# Patient Record
Sex: Female | Born: 1975 | Race: White | Hispanic: No | Marital: Single | Smoking: Never smoker
Health system: Southern US, Community
[De-identification: ages and names within clinical notes are randomized; demographics above are authoritative.]

## PROBLEM LIST (undated history)

## (undated) DIAGNOSIS — F32A Depression, unspecified: Secondary | ICD-10-CM

## (undated) DIAGNOSIS — F329 Major depressive disorder, single episode, unspecified: Secondary | ICD-10-CM

## (undated) DIAGNOSIS — E079 Disorder of thyroid, unspecified: Secondary | ICD-10-CM

---

## 2014-12-14 HISTORY — PX: AUGMENTATION MAMMAPLASTY: SUR837

## 2014-12-14 HISTORY — PX: REDUCTION MAMMAPLASTY: SUR839

## 2015-10-18 ENCOUNTER — Other Ambulatory Visit: Payer: Self-pay | Admitting: Physician Assistant

## 2015-10-18 DIAGNOSIS — Z1239 Encounter for other screening for malignant neoplasm of breast: Secondary | ICD-10-CM

## 2015-10-31 ENCOUNTER — Other Ambulatory Visit: Payer: Self-pay | Admitting: Physician Assistant

## 2015-10-31 ENCOUNTER — Ambulatory Visit
Admission: RE | Admit: 2015-10-31 | Discharge: 2015-10-31 | Disposition: A | Discharge status: Home / Self Care | Payer: BC Managed Care – PPO | Source: Ambulatory Visit | Attending: Physician Assistant | Admitting: Physician Assistant

## 2015-10-31 DIAGNOSIS — Z1239 Encounter for other screening for malignant neoplasm of breast: Secondary | ICD-10-CM

## 2015-10-31 DIAGNOSIS — Z1231 Encounter for screening mammogram for malignant neoplasm of breast: Secondary | ICD-10-CM | POA: Insufficient documentation

## 2015-11-03 ENCOUNTER — Ambulatory Visit: Payer: Self-pay

## 2015-11-09 ENCOUNTER — Inpatient Hospital Stay
Admission: RE | Admit: 2015-11-09 | Discharge: 2015-11-09 | Disposition: A | Discharge status: Home / Self Care | Payer: Self-pay | Source: Ambulatory Visit | Attending: *Deleted | Admitting: *Deleted

## 2015-11-09 ENCOUNTER — Other Ambulatory Visit: Payer: Self-pay | Admitting: *Deleted

## 2015-11-09 DIAGNOSIS — Z9289 Personal history of other medical treatment: Secondary | ICD-10-CM

## 2016-05-26 ENCOUNTER — Ambulatory Visit
Admission: EM | Admit: 2016-05-26 | Discharge: 2016-05-26 | Disposition: A | Discharge status: Home / Self Care | Payer: BC Managed Care – PPO | Attending: Family Medicine | Admitting: Family Medicine

## 2016-05-26 DIAGNOSIS — J40 Bronchitis, not specified as acute or chronic: Secondary | ICD-10-CM

## 2016-05-26 DIAGNOSIS — J0100 Acute maxillary sinusitis, unspecified: Secondary | ICD-10-CM | POA: Diagnosis not present

## 2016-05-26 MED ORDER — AMOXICILLIN-POT CLAVULANATE 875-125 MG PO TABS: 1.0000 | ORAL_TABLET | Freq: Two times a day (BID) | ORAL | 0 refills | Status: DC

## 2016-05-26 MED ORDER — BENZONATATE 200 MG PO CAPS: 200.0000 mg | ORAL_CAPSULE | Freq: Three times a day (TID) | ORAL | 0 refills | Status: DC | PRN

## 2016-05-26 MED ORDER — FEXOFENADINE-PSEUDOEPHED ER 180-240 MG PO TB24: 1.0000 | ORAL_TABLET | Freq: Every day | ORAL | 0 refills | Status: DC

## 2016-05-26 NOTE — ED Triage Notes (Signed)
Patient complains of sinus pain and pressure, possible bronchitis with coughing. Patient states that she is leaving in 3 hours to go out of the country for a cruise. Patient states that this has been ongoing for 13 days. Patient reports that she saw her primary care on day 10 and was given Cheratussin and was told to call back if not improving.

## 2016-05-26 NOTE — ED Provider Notes (Signed)
[facility]    CSN: [id number] Arrival date & time: 05/26/16  4098     History   Chief Complaint Chief Complaint  Patient presents with  . Sinusitis    HPI Sarah Vance is a 41 y.o. female.   Patient is here because of pressure swelling maxillary facial area. And cough. She states she normally often will get sinus infection she has a cold. She states this symptoms started the day after Easter which are benign April 2. She claims also that she went to see her PCP on Wednesday discussed this with her PCP and the PVC even though this may going on that point for 9 days only gave her some Cheratussin. She states that she is been unable to sleep up off of the night coughing and congestion. She states this started off with the nasal congestion and now is gone into the lungs. The last few days is progressively gotten worse and she is unable to sleep at night. She's only got a few hours of sleep last night and she's pain to board a plane about 2 hours to head to a cruise. She states feels miserable and wants to be put on a Z-Pak. She denies any smoking no known drug allergies. She reports having wisdom teeth removed for breast augmentation and gallbladder removal. Only medical problems hypothyroidism and she denies any pertinent family medical history relevant to today's visit   The history is provided by the patient. No language interpreter was used.  Sinusitis  Pain details:    Location:  Maxillary   Quality:  Aching   Timing:  Constant Duration:  12 days Progression:  Worsening Chronicity:  New Context: recent URI   Relieved by:  Nothing Worsened by:  Nothing Ineffective treatments:  Inhaler use Associated symptoms: cough     History reviewed. No pertinent past medical history.  There are no active problems to display for this patient.   Past Surgical History:  Procedure Laterality Date  . AUGMENTATION MAMMAPLASTY Bilateral 12/2014   had mammoplasty-lift    OB  History    No data available       Home Medications    Prior to Admission medications   Medication Sig Start Date End Date Taking? Authorizing Provider  cholecalciferol (VITAMIN D) 400 units TABS tablet Take 400 Units by mouth.   Yes Historical Provider, MD  FLUoxetine (PROZAC) 20 MG tablet Take 20 mg by mouth daily.   Yes Historical Provider, MD  levothyroxine (SYNTHROID, LEVOTHROID) 112 MCG tablet Take 112 mcg by mouth daily before breakfast.   Yes Historical Provider, MD  amoxicillin-clavulanate (AUGMENTIN) 875-125 MG tablet Take 1 tablet by mouth 2 (two) times daily. 05/26/16   Hassan Rowan, MD  benzonatate (TESSALON) 200 MG capsule Take 1 capsule (200 mg total) by mouth 3 (three) times daily as needed. 05/26/16   Hassan Rowan, MD  fexofenadine-pseudoephedrine (ALLEGRA-D ALLERGY & CONGESTION) 180-240 MG 24 hr tablet Take 1 tablet by mouth daily. 05/26/16   Hassan Rowan, MD    Family History Family History  Problem Relation Age of Onset  . Breast cancer Neg Hx     Social History Social History  Substance Use Topics  . Smoking status: Never Smoker  . Smokeless tobacco: Never Used  . Alcohol use No     Allergies   Patient has no known allergies.   Review of Systems Review of Systems  HENT: Positive for facial swelling, sinus pain and sinus pressure.   Respiratory: Positive for  cough.   All other systems reviewed and are negative.    Physical Exam Triage Vital Signs ED Triage Vitals  Enc Vitals Group     BP 05/26/16 0936 138/87     Pulse Rate 05/26/16 0936 83     Resp 05/26/16 0936 18     Temp 05/26/16 0936 98.5 F (36.9 C)     Temp Source 05/26/16 0936 Oral     SpO2 05/26/16 0936 95 %     Weight 05/26/16 0934 183 lb (83 kg)     Height 05/26/16 0934  (1.626 m)     Head Circumference --      Peak Flow --      Pain Score 05/26/16 0934 8     Pain Loc --      Pain Edu? --      Excl. in GC? --    No data found.   Updated Vital Signs BP 138/87 (BP  Location: Left Arm)   Pulse 83   Temp 98.5 F (36.9 C) (Oral)   Resp 18   Ht  (1.626 m)   Wt 183 lb (83 kg)   LMP 05/16/2016   SpO2 95%   BMI 31.41 kg/m   Visual Acuity Right Eye Distance:   Left Eye Distance:   Bilateral Distance:    Right Eye Near:   Left Eye Near:    Bilateral Near:     Physical Exam  Constitutional: She is oriented to person, place, and time. She appears well-developed and well-nourished.  HENT:  Head: Normocephalic and atraumatic.  Right Ear: Hearing, tympanic membrane, external ear and ear canal normal.  Left Ear: Hearing, tympanic membrane, external ear and ear canal normal.  Nose: Mucosal edema present. Right sinus exhibits no maxillary sinus tenderness and no frontal sinus tenderness. Left sinus exhibits maxillary sinus tenderness. Left sinus exhibits no frontal sinus tenderness.  Mouth/Throat: Uvula is midline, oropharynx is clear and moist and mucous membranes are normal.  Eyes: Conjunctivae, EOM and lids are normal. Pupils are equal, round, and reactive to light.  Neck: Trachea normal, normal range of motion and full passive range of motion without pain. Neck supple.  Cardiovascular: Normal rate and regular rhythm.   Pulmonary/Chest: Effort normal.  Patient actively coughing  Musculoskeletal: Normal range of motion.  Lymphadenopathy:    She has cervical adenopathy.  Neurological: She is alert and oriented to person, place, and time.  Skin: Skin is warm.  Psychiatric: She has a normal mood and affect.  Vitals reviewed.    UC Treatments / Results  Labs (all labs ordered are listed, but only abnormal results are displayed) Labs Reviewed - No data to display  EKG  EKG Interpretation None       Radiology No results found.  Procedures Procedures (including critical care time)  Medications Ordered in UC Medications - No data to display   Initial Impression / Assessment and Plan / UC Course  I have reviewed the triage vital  signs and the nursing notes.  Pertinent labs & imaging results that were available during my care of the patient were reviewed by me and considered in my medical decision making (see chart for details).     Patient may well have a sinus infection is progressively gotten worse and even a bronchitis since according to her history this is going on for 12 days down is progressively getting worse. But said place on Z-Pak and she requested recommending Augmentin 875 for 10 days she's  been on Cheratussin by her doctor I'm going recommend Tessalon Perles she states has worked effectively for her acquiesce to that and I'm also place on Allegra-D 1 tablet a day.  Final Clinical Impressions(s) / UC Diagnoses   Final diagnoses:  Acute maxillary sinusitis, recurrence not specified  Bronchitis    New Prescriptions New Prescriptions   AMOXICILLIN-CLAVULANATE (AUGMENTIN) 875-125 MG TABLET    Take 1 tablet by mouth 2 (two) times daily.   BENZONATATE (TESSALON) 200 MG CAPSULE    Take 1 capsule (200 mg total) by mouth 3 (three) times daily as needed.   FEXOFENADINE-PSEUDOEPHEDRINE (ALLEGRA-D ALLERGY & CONGESTION) 180-240 MG 24 HR TABLET    Take 1 tablet by mouth daily.     Note: This dictation was prepared with Dragon dictation along with smaller phrase technology. Any transcriptional errors that result from this process are unintentional.   Hassan Rowan, MD 05/26/16 1030

## 2016-10-12 ENCOUNTER — Other Ambulatory Visit: Payer: Self-pay | Admitting: Family Medicine

## 2016-10-12 DIAGNOSIS — Z1231 Encounter for screening mammogram for malignant neoplasm of breast: Secondary | ICD-10-CM

## 2016-11-14 ENCOUNTER — Ambulatory Visit
Admission: RE | Admit: 2016-11-14 | Discharge: 2016-11-14 | Disposition: A | Discharge status: Home / Self Care | Payer: BC Managed Care – PPO | Source: Ambulatory Visit | Attending: Family Medicine | Admitting: Family Medicine

## 2016-11-14 DIAGNOSIS — Z1231 Encounter for screening mammogram for malignant neoplasm of breast: Secondary | ICD-10-CM | POA: Diagnosis not present

## 2016-12-24 ENCOUNTER — Other Ambulatory Visit: Payer: Self-pay | Admitting: Physician Assistant

## 2016-12-24 DIAGNOSIS — R221 Localized swelling, mass and lump, neck: Secondary | ICD-10-CM

## 2016-12-26 ENCOUNTER — Other Ambulatory Visit: Payer: Self-pay | Admitting: Physician Assistant

## 2016-12-26 DIAGNOSIS — R221 Localized swelling, mass and lump, neck: Secondary | ICD-10-CM

## 2016-12-26 DIAGNOSIS — E039 Hypothyroidism, unspecified: Secondary | ICD-10-CM

## 2016-12-27 ENCOUNTER — Ambulatory Visit
Admission: RE | Admit: 2016-12-27 | Discharge: 2016-12-27 | Disposition: A | Discharge status: Home / Self Care | Payer: BC Managed Care – PPO | Source: Ambulatory Visit | Attending: Physician Assistant | Admitting: Physician Assistant

## 2016-12-27 ENCOUNTER — Ambulatory Visit: Payer: BC Managed Care – PPO

## 2016-12-27 DIAGNOSIS — E039 Hypothyroidism, unspecified: Secondary | ICD-10-CM | POA: Diagnosis not present

## 2016-12-27 DIAGNOSIS — R221 Localized swelling, mass and lump, neck: Secondary | ICD-10-CM | POA: Diagnosis not present

## 2017-03-09 ENCOUNTER — Encounter: Payer: Self-pay | Admitting: Gynecology

## 2017-03-09 ENCOUNTER — Other Ambulatory Visit: Payer: Self-pay

## 2017-03-09 ENCOUNTER — Ambulatory Visit
Admission: EM | Admit: 2017-03-09 | Discharge: 2017-03-09 | Disposition: A | Discharge status: Home / Self Care | Payer: BC Managed Care – PPO | Attending: Family Medicine | Admitting: Family Medicine

## 2017-03-09 DIAGNOSIS — R05 Cough: Secondary | ICD-10-CM

## 2017-03-09 DIAGNOSIS — J029 Acute pharyngitis, unspecified: Secondary | ICD-10-CM | POA: Diagnosis not present

## 2017-03-09 HISTORY — DX: Major depressive disorder, single episode, unspecified: F32.9

## 2017-03-09 HISTORY — DX: Depression, unspecified: F32.A

## 2017-03-09 HISTORY — DX: Disorder of thyroid, unspecified: E07.9

## 2017-03-09 LAB — RAPID STREP SCREEN (MED CTR MEBANE ONLY): STREPTOCOCCUS, GROUP A SCREEN (DIRECT): NEGATIVE

## 2017-03-09 MED ORDER — LIDOCAINE VISCOUS 2 % MT SOLN: 10.0000 mL | OROMUCOSAL | 0 refills | Status: AC | PRN

## 2017-03-09 NOTE — ED Triage Notes (Signed)
Patient c/o sore throat x 4 days. 

## 2017-03-09 NOTE — Discharge Instructions (Addendum)
Recommend use Lidocaine mouth wash- take 2 teaspoons and swish and spit out every 4 hours as needed. Take Tylenol 1000mg  or Ibuprofen 600mg  every 8 hours as needed for throat pain. Follow-up pending strep culture results and in 3 days with your PCP if not improving.

## 2017-03-09 NOTE — ED Provider Notes (Signed)
[facility]    CSN: [id number] Arrival date & time: 03/09/17  2637     History   Chief Complaint Chief Complaint  Patient presents with  . Sore Throat    HPI Sarah Vance is a 42 y.o. female.   42 year old female presents with sore throat for the past 4 days. Has gotten worse this morning. Experiencing minimal congestion- took a decongestant 2 days ago and resolved. Also having a mild dry cough and swollen glands. Denies any fever or GI symptoms. Multiple people at work are ill. No known exposure to strep or influenza. Other chronic health issues include thyroid disorder and depression and currently on Synthroid and Prozac daily.    The history is provided by the patient.    Past Medical History:  Diagnosis Date  . Depression   . Thyroid disease     There are no active problems to display for this patient.   Past Surgical History:  Procedure Laterality Date  . AUGMENTATION MAMMAPLASTY Bilateral 12/2014   had mammoplasty-lift    OB History    No data available       Home Medications    Prior to Admission medications   Medication Sig Start Date End Date Taking? Authorizing Provider  cholecalciferol (VITAMIN D) 400 units TABS tablet Take 400 Units by mouth.   Yes [provider]  FLUoxetine (PROZAC) 20 MG tablet Take 20 mg by mouth daily.   Yes [provider]  levothyroxine (SYNTHROID, LEVOTHROID) 112 MCG tablet Take 112 mcg by mouth daily before breakfast.   Yes [provider]  lidocaine (XYLOCAINE) 2 % solution Use as directed 10 mLs in the mouth or throat every 4 (four) hours as needed for mouth pain. 03/09/17   Sudie Grumbling, NP    Family History Family History  Problem Relation Age of Onset  . Breast cancer Neg Hx     Social History Social History   Tobacco Use  . Smoking status: Never Smoker  . Smokeless tobacco: Never Used  Substance Use Topics  . Alcohol use: No  . Drug use: No     Allergies     Patient has no known allergies.   Review of Systems Review of Systems  Constitutional: Positive for fatigue. Negative for activity change, appetite change, chills and fever.  HENT: Positive for postnasal drip and sore throat. Negative for congestion, ear discharge, ear pain, mouth sores, nosebleeds, rhinorrhea, sinus pressure, sinus pain and trouble swallowing.   Eyes: Negative for pain, discharge, redness and itching.  Respiratory: Positive for cough. Negative for chest tightness, shortness of breath and wheezing.   Gastrointestinal: Negative for abdominal pain, diarrhea, nausea and vomiting.  Musculoskeletal: Negative for arthralgias, myalgias, neck pain and neck stiffness.  Skin: Negative for rash and wound.  Neurological: Positive for headaches. Negative for dizziness, tremors, seizures, syncope, weakness, light-headedness and numbness.  Hematological: Positive for adenopathy. Does not bruise/bleed easily.     Physical Exam Triage Vital Signs ED Triage Vitals  Enc Vitals Group     BP 03/09/17 0827 119/77     Pulse Rate 03/09/17 0827 76     Resp 03/09/17 0827 16     Temp 03/09/17 0827 98.5 F (36.9 C)     Temp Source 03/09/17 0827 Oral     SpO2 03/09/17 0827 99 %     Weight 03/09/17 0829 175 lb (79.4 kg)     Height 03/09/17 0829 5\' 4"  (1.626 m)  Head Circumference --      Peak Flow --      Pain Score 03/09/17 0829 6     Pain Loc --      Pain Edu? --      Excl. in GC? --    No data found.  Updated Vital Signs BP 119/77 (BP Location: Left Arm)   Pulse 76   Temp 98.5 F (36.9 C) (Oral)   Resp 16   Ht 5\' 4"  (1.626 m)   Wt 175 lb (79.4 kg)   LMP 02/17/2017   SpO2 99%   BMI 30.04 kg/m   Visual Acuity Right Eye Distance:   Left Eye Distance:   Bilateral Distance:    Right Eye Near:   Left Eye Near:    Bilateral Near:     Physical Exam  Constitutional: She is oriented to person, place, and time. She appears well-developed and well-nourished. She does not  appear ill. No distress.  HENT:  Head: Normocephalic and atraumatic.  Right Ear: Hearing, tympanic membrane, external ear and ear canal normal.  Left Ear: Hearing, tympanic membrane, external ear and ear canal normal.  Nose: Nose normal. Right sinus exhibits no maxillary sinus tenderness and no frontal sinus tenderness. Left sinus exhibits no maxillary sinus tenderness and no frontal sinus tenderness.  Mouth/Throat: Uvula is midline and mucous membranes are normal. Posterior oropharyngeal erythema present.  Eyes: Conjunctivae and EOM are normal.  Neck: Normal range of motion. Neck supple.  Cardiovascular: Normal rate, regular rhythm and normal heart sounds.  No murmur heard. Pulmonary/Chest: Effort normal and breath sounds normal. No respiratory distress. She has no decreased breath sounds. She has no wheezes. She has no rhonchi.  Musculoskeletal: Normal range of motion.  Lymphadenopathy:       Head (right side): Tonsillar adenopathy present.       Head (left side): Tonsillar adenopathy present.    She has cervical adenopathy.       Right cervical: Deep cervical adenopathy present.       Left cervical: Deep cervical adenopathy present.  Neurological: She is alert and oriented to person, place, and time.  Skin: Skin is warm and dry. Capillary refill takes less than 2 seconds.  Psychiatric: She has a normal mood and affect. Her behavior is normal. Judgment and thought content normal.     UC Treatments / Results  Labs (all labs ordered are listed, but only abnormal results are displayed) Labs Reviewed  RAPID STREP SCREEN (NOT AT [facility])  CULTURE, GROUP A STREP [facility])    EKG  EKG Interpretation None       Radiology No results found.  Procedures Procedures (including critical care time)  Medications Ordered in UC Medications - No data to display   Initial Impression / Assessment and Plan / UC Course  I have reviewed the triage vital signs and the nursing  notes.  Pertinent labs & imaging results that were available during my care of the patient were reviewed by me and considered in my medical decision making (see chart for details).    Reviewed negative rapid strep test with patient. Discussed that she probably has a viral illness. Recommend use Lidocaine mouth wash- take 2 teaspoons and swish and spit out every 4 hours as needed. Take Tylenol 1000mg  or Ibuprofen 600mg  every 8 hours as needed for throat pain. Follow-up pending strep culture results and in 3 days with her PCP if not improving.    Final Clinical Impressions(s) / UC Diagnoses  Final diagnoses:  Pharyngitis, unspecified etiology    ED Discharge Orders        Ordered    lidocaine (XYLOCAINE) 2 % solution  Every 4 hours PRN     03/09/17 0900       Controlled Substance Prescriptions [place] Controlled Substance Registry consulted? Not Applicable   Sudie Grumbling, NP 03/09/17 2136

## 2017-03-12 LAB — CULTURE, GROUP A STREP (THRC)

## 2017-11-13 ENCOUNTER — Other Ambulatory Visit: Payer: Self-pay | Admitting: Physician Assistant

## 2017-11-13 DIAGNOSIS — Z1231 Encounter for screening mammogram for malignant neoplasm of breast: Secondary | ICD-10-CM

## 2017-11-20 ENCOUNTER — Ambulatory Visit
Admission: RE | Admit: 2017-11-20 | Discharge: 2017-11-20 | Disposition: A | Discharge status: Home / Self Care | Payer: BC Managed Care – PPO | Source: Ambulatory Visit | Attending: Physician Assistant | Admitting: Physician Assistant

## 2017-11-20 DIAGNOSIS — Z1231 Encounter for screening mammogram for malignant neoplasm of breast: Secondary | ICD-10-CM | POA: Insufficient documentation

## 2019-11-24 ENCOUNTER — Encounter: Payer: Self-pay | Admitting: Emergency Medicine

## 2019-11-24 ENCOUNTER — Ambulatory Visit
Admission: EM | Admit: 2019-11-24 | Discharge: 2019-11-24 | Disposition: A | Discharge status: Home / Self Care | Payer: BC Managed Care – PPO | Attending: Physician Assistant | Admitting: Physician Assistant

## 2019-11-24 ENCOUNTER — Other Ambulatory Visit: Payer: Self-pay

## 2019-11-24 DIAGNOSIS — R0981 Nasal congestion: Secondary | ICD-10-CM

## 2019-11-24 DIAGNOSIS — J019 Acute sinusitis, unspecified: Secondary | ICD-10-CM

## 2019-11-24 DIAGNOSIS — R059 Cough, unspecified: Secondary | ICD-10-CM | POA: Diagnosis not present

## 2019-11-24 MED ORDER — AMOXICILLIN-POT CLAVULANATE 875-125 MG PO TABS: 1.0000 | ORAL_TABLET | Freq: Two times a day (BID) | ORAL | 0 refills | Status: AC

## 2019-11-24 NOTE — ED Provider Notes (Signed)
[facility]    CSN: [id number] Arrival date & time: 11/24/19  1209      History   Chief Complaint Chief Complaint  Patient presents with  . Cough  . Nasal Congestion    HPI Sarah Vance is a 44 y.o. female presenting for 1 week history of fatigue, sinus pain and pressure, nasal congestion, headaches, and nonproductive cough.  Patient denies any fevers.  She says that she is not gotten any better or worse over the past week.  She states that she has tested herself for strep and Covid at her workplace and that her testing was negative.  Denies known Covid exposure.  Patient has been fully vaccinated for Covid.  She states that every time she gets a cold it turns into a sinus infection where the symptoms just linger and do not go away without antibiotic.  She says that she is tried multiple over-the-counter medications including decongestants and cough syrups without relief of symptoms.  She denies any breathing difficulty or chest pain.  No other complaints or concerns today.  HPI  Past Medical History:  Diagnosis Date  . Depression   . Thyroid disease     There are no problems to display for this patient.   Past Surgical History:  Procedure Laterality Date  . AUGMENTATION MAMMAPLASTY Bilateral 12/2014   had mammoplasty-lift    OB History   No obstetric history on file.      Home Medications    Prior to Admission medications   Medication Sig Start Date End Date Taking? Authorizing Provider  cholecalciferol (VITAMIN D) 400 units TABS tablet Take 400 Units by mouth.   Yes [provider]  FLUoxetine (PROZAC) 20 MG tablet Take 20 mg by mouth daily.   Yes [provider]  levothyroxine (SYNTHROID, LEVOTHROID) 112 MCG tablet Take 112 mcg by mouth daily before breakfast.   Yes [provider]  amoxicillin-clavulanate (AUGMENTIN) 875-125 MG tablet Take 1 tablet by mouth every 12 (twelve) hours for 7 days. 11/24/19 12/01/19  Eusebio Friendly  B, PA-C  lidocaine (XYLOCAINE) 2 % solution Use as directed 10 mLs in the mouth or throat every 4 (four) hours as needed for mouth pain. 03/09/17   Sudie Grumbling, NP    Family History Family History  Problem Relation Age of Onset  . Breast cancer Neg Hx     Social History Social History   Tobacco Use  . Smoking status: Never Smoker  . Smokeless tobacco: Never Used  Substance Use Topics  . Alcohol use: No  . Drug use: No     Allergies   Patient has no known allergies.   Review of Systems Review of Systems  Constitutional: Positive for fatigue. Negative for chills, diaphoresis and fever.  HENT: Positive for congestion, postnasal drip, rhinorrhea, sinus pressure and sinus pain. Negative for ear pain and sore throat.   Respiratory: Positive for cough. Negative for shortness of breath.   Cardiovascular: Negative for chest pain.  Gastrointestinal: Negative for abdominal pain, nausea and vomiting.  Musculoskeletal: Negative for arthralgias and myalgias.  Skin: Negative for rash.  Neurological: Negative for weakness and headaches.  Hematological: Negative for adenopathy.     Physical Exam Triage Vital Signs ED Triage Vitals  Enc Vitals Group     BP 11/24/19 1354 (!) 152/98     Pulse Rate 11/24/19 1354 77     Resp 11/24/19 1354 18     Temp 11/24/19 1354 99.1 F (37.3 C)  Temp Source 11/24/19 1354 Oral     SpO2 11/24/19 1354 100 %     Weight 11/24/19 1352 180 lb (81.6 kg)     Height 11/24/19 1352 5\' 4"  (1.626 m)     Head Circumference --      Peak Flow --      Pain Score 11/24/19 1352 2     Pain Loc --      Pain Edu? --      Excl. in GC? --    No data found.  Updated Vital Signs BP (!) 152/98 (BP Location: Right Arm)   Pulse 77   Temp 99.1 F (37.3 C) (Oral)   Resp 18   Ht 5\' 4"  (1.626 m)   Wt 180 lb (81.6 kg)   LMP 11/10/2019   SpO2 100%   BMI 30.90 kg/m    Physical Exam Vitals and nursing note reviewed.  Constitutional:      General: She is  not in acute distress.    Appearance: Normal appearance. She is not ill-appearing or toxic-appearing.  HENT:     Head: Normocephalic and atraumatic.     Right Ear: Tympanic membrane, ear canal and external ear normal.     Left Ear: Tympanic membrane, ear canal and external ear normal.     Nose: Congestion and rhinorrhea (trace clear) present.     Right Sinus: Maxillary sinus tenderness and frontal sinus tenderness present.     Left Sinus: Maxillary sinus tenderness and frontal sinus tenderness present.     Comments: Patent nostrils    Mouth/Throat:     Mouth: Mucous membranes are moist.     Pharynx: Oropharynx is clear. No posterior oropharyngeal erythema.  Eyes:     General: No scleral icterus.       Right eye: No discharge.        Left eye: No discharge.     Conjunctiva/sclera: Conjunctivae normal.  Cardiovascular:     Rate and Rhythm: Normal rate and regular rhythm.     Heart sounds: Normal heart sounds.  Pulmonary:     Effort: Pulmonary effort is normal. No respiratory distress.     Breath sounds: Normal breath sounds.  Musculoskeletal:     Cervical back: Neck supple.  Skin:    General: Skin is dry.  Neurological:     General: No focal deficit present.     Mental Status: She is alert. Mental status is at baseline.     Motor: No weakness.     Gait: Gait normal.  Psychiatric:        Mood and Affect: Mood normal.        Behavior: Behavior normal.        Thought Content: Thought content normal.      UC Treatments / Results  Labs (all labs ordered are listed, but only abnormal results are displayed) Labs Reviewed - No data to display  EKG   Radiology No results found.  Procedures Procedures (including critical care time)  Medications Ordered in UC Medications - No data to display  Initial Impression / Assessment and Plan / UC Course  I have reviewed the triage vital signs and the nursing notes.  Pertinent labs & imaging results that were available during my  care of the patient were reviewed by me and considered in my medical decision making (see chart for details).   44 year old female complaining of 1 week history of cough, congestion, sinus pain and pressure.  Advised patient that symptoms are still  likely a viral infection and should get better over the next few days, but patient says that usually she is antibiotic to get over this.  Patient asking for Augmentin at this time.  Advised her to hold off on medication for at least 3 to 4 days.  She may likely get better on her own.  Advised can use any over-the-counter decongestants.  Follow-up as needed for any new or worsening symptoms.  If she starts antibiotic she should finish it.  Final Clinical Impressions(s) / UC Diagnoses   Final diagnoses:  Acute sinusitis, recurrence not specified, unspecified location  Nasal congestion  Cough     Discharge Instructions     SINUSITIS: As discussed, your symptoms are likely viral and are just lingering for a little longer than normal.  I would advise he wait another 3 to 4 days before starting antibiotics as she likely do not actually need them.  Reviewed use of a nasal saline irrigation system. May consider use of intranasal steroids, such as Flonase as well. Use medications as directed. If antibiotics are prescribed, take the full course of antibiotics. Also consider use of Sudafed or Mucinex D. Increase rest, fluids. If you do not improve or if you worsen after a course of antibiotics, you should be re-examined. You may need a different antibiotic or further evaluation with imaging or an exam of the inside of the sinuses     ED Prescriptions    Medication Sig Dispense Auth. Provider   amoxicillin-clavulanate (AUGMENTIN) 875-125 MG tablet Take 1 tablet by mouth every 12 (twelve) hours for 7 days. 14 tablet Gareth Morgan     PDMP not reviewed this encounter.   Shirlee Latch, PA-C 11/24/19 1427

## 2019-11-24 NOTE — ED Triage Notes (Addendum)
Patient c/o cough, nasal congestion that started over 1 week ago. Denies fever. 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

## 2019-11-24 NOTE — Discharge Instructions (Addendum)
SINUSITIS: As discussed, your symptoms are likely viral and are just lingering for a little longer than normal.  I would advise he wait another 3 to 4 days before starting antibiotics as she likely do not actually need them.  Reviewed use of a nasal saline irrigation system. May consider use of intranasal steroids, such as Flonase as well. Use medications as directed. If antibiotics are prescribed, take the full course of antibiotics. Also consider use of Sudafed or Mucinex D. Increase rest, fluids. If you do not improve or if you worsen after a course of antibiotics, you should be re-examined. You may need a different antibiotic or further evaluation with imaging or an exam of the inside of the sinuses

## 2020-05-11 ENCOUNTER — Other Ambulatory Visit: Payer: Self-pay | Admitting: Family Medicine

## 2020-05-11 DIAGNOSIS — Z1231 Encounter for screening mammogram for malignant neoplasm of breast: Secondary | ICD-10-CM

## 2020-11-21 ENCOUNTER — Other Ambulatory Visit: Payer: Self-pay

## 2020-11-21 ENCOUNTER — Ambulatory Visit
Admission: RE | Admit: 2020-11-21 | Discharge: 2020-11-21 | Disposition: A | Discharge status: Home / Self Care | Payer: BC Managed Care – PPO | Source: Ambulatory Visit | Attending: Family Medicine | Admitting: Family Medicine

## 2020-11-21 DIAGNOSIS — Z1231 Encounter for screening mammogram for malignant neoplasm of breast: Secondary | ICD-10-CM | POA: Diagnosis not present

## 2021-09-11 ENCOUNTER — Other Ambulatory Visit: Payer: Self-pay | Admitting: Physician Assistant

## 2021-09-11 DIAGNOSIS — Z1231 Encounter for screening mammogram for malignant neoplasm of breast: Secondary | ICD-10-CM

## 2021-09-26 ENCOUNTER — Other Ambulatory Visit: Payer: Self-pay

## 2021-09-26 ENCOUNTER — Telehealth: Payer: Self-pay

## 2021-09-26 DIAGNOSIS — Z1211 Encounter for screening for malignant neoplasm of colon: Secondary | ICD-10-CM

## 2021-09-26 MED ORDER — NA SULFATE-K SULFATE-MG SULF 17.5-3.13-1.6 GM/177ML PO SOLN: 1.0000 | Freq: Once | ORAL | 0 refills | Status: AC

## 2021-09-26 NOTE — Telephone Encounter (Signed)
Gastroenterology Pre-Procedure Review  Request Date: 11/17/21 Requesting Physician: Dr. Servando Snare  PATIENT REVIEW QUESTIONS: The patient responded to the following health history questions as indicated:    1. Are you having any GI issues? no 2. Do you have a personal history of Polyps? no 3. Do you have a family history of Colon Cancer or Polyps? no 4. Diabetes Mellitus? no 5. Joint replacements in the past 12 months?no 6. Major health problems in the past 3 months?no 7. Any artificial heart valves, MVP, or defibrillator?no    MEDICATIONS & ALLERGIES:    Patient reports the following regarding taking any anticoagulation/antiplatelet therapy:   Plavix, Coumadin, Eliquis, Xarelto, Lovenox, Pradaxa, Brilinta, or Effient? no Aspirin? no  Patient confirms/reports the following medications:  Current Outpatient Medications  Medication Sig Dispense Refill   cholecalciferol (VITAMIN D) 400 units TABS tablet Take 400 Units by mouth.     FLUoxetine (PROZAC) 20 MG tablet Take 20 mg by mouth daily.     levothyroxine (SYNTHROID, LEVOTHROID) 112 MCG tablet Take 112 mcg by mouth daily before breakfast.     lidocaine (XYLOCAINE) 2 % solution Use as directed 10 mLs in the mouth or throat every 4 (four) hours as needed for mouth pain. 100 mL 0   No current facility-administered medications for this visit.    Patient confirms/reports the following allergies:  No Known Allergies  No orders of the defined types were placed in this encounter.   AUTHORIZATION INFORMATION Primary Insurance: 1D#: Group #:  Secondary Insurance: 1D#: Group #:  SCHEDULE INFORMATION: Date: 11/17/21 Time: Location: [facility]

## 2021-11-10 ENCOUNTER — Encounter: Payer: Self-pay | Admitting: Gastroenterology

## 2021-11-10 ENCOUNTER — Other Ambulatory Visit: Payer: Self-pay

## 2021-11-17 ENCOUNTER — Encounter: Payer: Self-pay | Admitting: Gastroenterology

## 2021-11-17 ENCOUNTER — Other Ambulatory Visit: Payer: Self-pay

## 2021-11-17 ENCOUNTER — Ambulatory Visit: Payer: BC Managed Care – PPO | Admitting: Anesthesiology

## 2021-11-17 ENCOUNTER — Encounter
Admission: RE | Disposition: A | Discharge status: Home / Self Care | Payer: Self-pay | Source: Home / Self Care | Attending: Gastroenterology

## 2021-11-17 ENCOUNTER — Ambulatory Visit
Admission: RE | Admit: 2021-11-17 | Discharge: 2021-11-17 | Disposition: A | Discharge status: Home / Self Care | Payer: BC Managed Care – PPO | Attending: Gastroenterology | Admitting: Gastroenterology

## 2021-11-17 DIAGNOSIS — Z1211 Encounter for screening for malignant neoplasm of colon: Secondary | ICD-10-CM

## 2021-11-17 DIAGNOSIS — K640 First degree hemorrhoids: Secondary | ICD-10-CM | POA: Insufficient documentation

## 2021-11-17 HISTORY — PX: COLONOSCOPY WITH PROPOFOL: SHX5780

## 2021-11-17 LAB — POCT PREGNANCY, URINE: Preg Test, Ur: NEGATIVE

## 2021-11-17 SURGERY — COLONOSCOPY WITH PROPOFOL
Anesthesia: General | Site: Rectum

## 2021-11-17 MED ORDER — PROPOFOL 10 MG/ML IV BOLUS
INTRAVENOUS | Status: DC | PRN
  Administered 2021-11-17: 50 mg via INTRAVENOUS
  Administered 2021-11-17: 60 mg via INTRAVENOUS
  Administered 2021-11-17: 90 mg via INTRAVENOUS
  Administered 2021-11-17: 20 mg via INTRAVENOUS
  Administered 2021-11-17: 30 mg via INTRAVENOUS

## 2021-11-17 MED ORDER — LACTATED RINGERS IV SOLN: INTRAVENOUS | Status: DC

## 2021-11-17 MED ORDER — STERILE WATER FOR IRRIGATION IR SOLN
Status: DC | PRN
  Administered 2021-11-17: 150 mL

## 2021-11-17 MED ORDER — LIDOCAINE HCL (CARDIAC) PF 100 MG/5ML IV SOSY
PREFILLED_SYRINGE | INTRAVENOUS | Status: DC | PRN
  Administered 2021-11-17: 40 mg via INTRAVENOUS

## 2021-11-17 MED ORDER — SODIUM CHLORIDE 0.9 % IV SOLN: INTRAVENOUS | Status: DC

## 2021-11-17 SURGICAL SUPPLY — 21 items

## 2021-11-17 NOTE — Anesthesia Postprocedure Evaluation (Signed)
Anesthesia Post Note  Patient: Sarah Vance  Procedure(s) Performed: COLONOSCOPY WITH PROPOFOL (Rectum)  Patient location during evaluation: PACU Anesthesia Type: General Level of consciousness: awake and alert Pain management: pain level controlled Vital Signs Assessment: post-procedure vital signs reviewed and stable Respiratory status: spontaneous breathing, nonlabored ventilation, respiratory function stable and patient connected to nasal cannula oxygen Cardiovascular status: blood pressure returned to baseline and stable Postop Assessment: no apparent nausea or vomiting Anesthetic complications: no   No notable events documented.   Last Vitals:  Vitals:   11/17/21 1215 11/17/21 1222  BP: 132/81 137/74  Pulse: 69 63  Resp: 20 10  Temp:  36.7 C  SpO2: 100% 100%    Last Pain:  Vitals:   11/17/21 1222  TempSrc:   PainSc: 0-No pain                 Precious Haws Aayush Gelpi

## 2021-11-17 NOTE — H&P (Signed)
   Lucilla Lame, MD [facility] [address] [place], [place] [address] Phone: [phone] Fax : [phone]  Primary Care Physician:  Kerri Perches, PA-C Primary Gastroenterologist:  Dr. Allen Norris  Pre-Procedure History & Physical: HPI:  Sarah Vance is a 46 y.o. female is here for a screening colonoscopy.   Past Medical History:  Diagnosis Date   Depression    Thyroid disease     Past Surgical History:  Procedure Laterality Date   AUGMENTATION MAMMAPLASTY Bilateral 12/2014   had mammoplasty-lift    Prior to Admission medications   Medication Sig Start Date End Date Taking? Authorizing Provider  cholecalciferol (VITAMIN D) 400 units TABS tablet Take 400 Units by mouth.   Yes [provider]  FLUoxetine (PROZAC) 20 MG tablet Take 20 mg by mouth daily.   Yes [provider]  levothyroxine (SYNTHROID, LEVOTHROID) 112 MCG tablet Take 112 mcg by mouth daily before breakfast.   Yes [provider]  lidocaine (XYLOCAINE) 2 % solution Use as directed 10 mLs in the mouth or throat every 4 (four) hours as needed for mouth pain. 03/09/17  Yes Amyot, Nicholes Stairs, NP  Omega-3 Fatty Acids (FISH OIL) 1000 MG CAPS Take by mouth.   Yes [provider]    Allergies as of 09/26/2021   (No Known Allergies)    Family History  Problem Relation Age of Onset   Breast cancer Neg Hx     Social History   Socioeconomic History   Marital status: Single    Spouse name: Not on file   Number of children: Not on file   Years of education: Not on file   Highest education level: Not on file  Occupational History   Not on file  Tobacco Use   Smoking status: Never   Smokeless tobacco: Never  Substance and Sexual Activity   Alcohol use: No   Drug use: No   Sexual activity: Not on file  Other Topics Concern   Not on file  Social History Narrative   Not on file   Social Determinants of Health   Financial Resource Strain: Not on file  Food Insecurity: Not on  file  Transportation Needs: Not on file  Physical Activity: Not on file  Stress: Not on file  Social Connections: Not on file  Intimate Partner Violence: Not on file    Review of Systems: See HPI, otherwise negative ROS  Physical Exam: BP (!) 135/93   Pulse 77   Temp 98.2 F (36.8 C) (Temporal)   Resp 13   Ht 5\' 4"  (1.626 m)   Wt 79.4 kg   LMP 10/13/2021   SpO2 99%   BMI 30.04 kg/m  General:   Alert,  pleasant and cooperative in NAD Head:  Normocephalic and atraumatic. Neck:  Supple; no masses or thyromegaly. Lungs:  Clear throughout to auscultation.    Heart:  Regular rate and rhythm. Abdomen:  Soft, nontender and nondistended. Normal bowel sounds, without guarding, and without rebound.   Neurologic:  Alert and  oriented x4;  grossly normal neurologically.  Impression/Plan: Sarah Vance is now here to undergo a screening colonoscopy.  Risks, benefits, and alternatives regarding colonoscopy have been reviewed with the patient.  Questions have been answered.  All parties agreeable.

## 2021-11-17 NOTE — Anesthesia Preprocedure Evaluation (Signed)
Anesthesia Evaluation  Patient identified by MRN, date of birth, ID band Patient awake    Reviewed: Allergy & Precautions, NPO status , Patient's Chart, lab work & pertinent test results  History of Anesthesia Complications (+) PROLONGED EMERGENCE and history of anesthetic complications  Airway Mallampati: III  TM Distance: >3 FB Neck ROM: full    Dental  (+) Chipped   Pulmonary neg pulmonary ROS, neg shortness of breath,    Pulmonary exam normal        Cardiovascular Exercise Tolerance: Good negative cardio ROS Normal cardiovascular exam     Neuro/Psych negative neurological ROS  negative psych ROS   GI/Hepatic negative GI ROS, Neg liver ROS, neg GERD  ,  Endo/Other  negative endocrine ROS  Renal/GU negative Renal ROS  negative genitourinary   Musculoskeletal   Abdominal   Peds  Hematology negative hematology ROS (+)   Anesthesia Other Findings Past Medical History: No date: Depression No date: Thyroid disease  Past Surgical History: 12/2014: AUGMENTATION MAMMAPLASTY; Bilateral     Comment:  had mammoplasty-lift  BMI    Body Mass Index: 30.04 kg/m      Reproductive/Obstetrics negative OB ROS                             Anesthesia Physical Anesthesia Plan  ASA: 2  Anesthesia Plan: General   Post-op Pain Management:    Induction: Intravenous  PONV Risk Score and Plan: Propofol infusion and TIVA  Airway Management Planned: Natural Airway and Nasal Cannula  Additional Equipment:   Intra-op Plan:   Post-operative Plan:   Informed Consent: I have reviewed the patients History and Physical, chart, labs and discussed the procedure including the risks, benefits and alternatives for the proposed anesthesia with the patient or authorized representative who has indicated his/her understanding and acceptance.     Dental Advisory Given  Plan Discussed with:  Anesthesiologist, CRNA and Surgeon  Anesthesia Plan Comments: (Patient consented for risks of anesthesia including but not limited to:  - adverse reactions to medications - risk of airway placement if required - damage to eyes, teeth, lips or other oral mucosa - nerve damage due to positioning  - sore throat or hoarseness - Damage to heart, brain, nerves, lungs, other parts of body or loss of life  Patient voiced understanding.)        Anesthesia Quick Evaluation

## 2021-11-17 NOTE — Transfer of Care (Signed)
Immediate Anesthesia Transfer of Care Note  Patient: Sarah Vance  Procedure(s) Performed: COLONOSCOPY WITH PROPOFOL (Rectum)  Patient Location: PACU  Anesthesia Type: General  Level of Consciousness: awake, alert  and patient cooperative  Airway and Oxygen Therapy: Patient Spontanous Breathing and Patient connected to supplemental oxygen  Post-op Assessment: Post-op Vital signs reviewed, Patient's Cardiovascular Status Stable, Respiratory Function Stable, Patent Airway and No signs of Nausea or vomiting  Post-op Vital Signs: Reviewed and stable  Complications: No notable events documented.

## 2021-11-17 NOTE — Op Note (Signed)
[facility] Gastroenterology Patient Name: Sarah Vance Procedure Date: 11/17/2021 11:38 AM MRN: [id number] Account #: [id number] Date of Birth: 07-13-1975 Admit Type: Outpatient Age: 46 Room: [facility] OR ROOM 01 Gender: Female Note Status: Finalized Instrument Name: [id number] Procedure:             Colonoscopy Indications:           Screening for colorectal malignant neoplasm Providers:             Lucilla Lame MD, MD Medicines:             Propofol per Anesthesia Complications:         No immediate complications. Procedure:             Pre-Anesthesia Assessment:                        - Prior to the procedure, a History and Physical was                         performed, and patient medications and allergies were                         reviewed. The patient's tolerance of previous                         anesthesia was also reviewed. The risks and benefits                         of the procedure and the sedation options and risks                         were discussed with the patient. All questions were                         answered, and informed consent was obtained. Prior                         Anticoagulants: The patient has taken no previous                         anticoagulant or antiplatelet agents. ASA Grade                         Assessment: II - A patient with mild systemic disease.                         After reviewing the risks and benefits, the patient                         was deemed in satisfactory condition to undergo the                         procedure.                        After obtaining informed consent, the colonoscope was                         passed under direct vision. Throughout the procedure,  the patient's blood pressure, pulse, and oxygen                         saturations were monitored continuously. The                         Colonoscope was introduced through the anus and                          advanced to the the cecum, identified by appendiceal                         orifice and ileocecal valve. The colonoscopy was                         performed without difficulty. The patient tolerated                         the procedure well. The quality of the bowel                         preparation was excellent. Findings:      The perianal and digital rectal examinations were normal.      Non-bleeding internal hemorrhoids were found during retroflexion. The       hemorrhoids were Grade I (internal hemorrhoids that do not prolapse). Impression:            - Non-bleeding internal hemorrhoids.                        - No specimens collected. Recommendation:        - Discharge patient to home.                        - Resume previous diet.                        - Continue present medications.                        - Repeat colonoscopy in 10 years for screening                         purposes. Procedure Code(s):     --- Professional ---                        [phone], Colonoscopy, flexible; diagnostic, including                         collection of specimen(s) by brushing or washing, when                         performed (separate procedure) Diagnosis Code(s):     --- Professional ---                        Z12.11, Encounter for screening for malignant neoplasm                         of colon CPT copyright 2019 [employer]. All rights reserved. The  codes documented in this report are preliminary and upon coder review may  be revised to meet current compliance requirements. Midge Minium MD, MD 11/17/2021 11:59:23 AM This report has been signed electronically. Number of Addenda: 0 Note Initiated On: 11/17/2021 11:38 AM Scope Withdrawal Time: 0 hours 7 minutes 43 seconds  Total Procedure Duration: 0 hours 10 minutes 34 seconds  Estimated Blood Loss:  Estimated blood loss: none.      [facility]

## 2021-11-20 ENCOUNTER — Encounter: Payer: Self-pay | Admitting: Gastroenterology

## 2021-11-22 ENCOUNTER — Ambulatory Visit
Admission: RE | Admit: 2021-11-22 | Discharge: 2021-11-22 | Disposition: A | Discharge status: Home / Self Care | Payer: BC Managed Care – PPO | Source: Ambulatory Visit | Attending: Physician Assistant | Admitting: Physician Assistant

## 2021-11-22 DIAGNOSIS — Z1231 Encounter for screening mammogram for malignant neoplasm of breast: Secondary | ICD-10-CM | POA: Insufficient documentation

## 2022-10-29 ENCOUNTER — Encounter: Payer: Self-pay | Admitting: Family Medicine

## 2022-10-29 DIAGNOSIS — Z1231 Encounter for screening mammogram for malignant neoplasm of breast: Secondary | ICD-10-CM

## 2022-11-01 ENCOUNTER — Other Ambulatory Visit: Payer: Self-pay | Admitting: Family Medicine

## 2022-11-01 DIAGNOSIS — Z1231 Encounter for screening mammogram for malignant neoplasm of breast: Secondary | ICD-10-CM

## 2022-11-28 ENCOUNTER — Ambulatory Visit
Admission: RE | Admit: 2022-11-28 | Discharge: 2022-11-28 | Disposition: A | Discharge status: Home / Self Care | Payer: BC Managed Care – PPO | Source: Ambulatory Visit | Attending: Family Medicine | Admitting: Family Medicine

## 2022-11-28 DIAGNOSIS — Z1231 Encounter for screening mammogram for malignant neoplasm of breast: Secondary | ICD-10-CM | POA: Insufficient documentation

## 2023-08-03 IMAGING — MG MM DIGITAL SCREENING BILAT W/ TOMO AND CAD
8 series · 8 of 24 positions shown · non-contrast
Comparison: Previous exam(s).

CLINICAL DATA: Screening.

EXAM:
DIGITAL SCREENING BILATERAL MAMMOGRAM WITH TOMOSYNTHESIS AND CAD
TECHNIQUE: Bilateral screening digital craniocaudal and mediolateral oblique
mammograms were obtained. Bilateral screening digital breast
tomosynthesis was performed. The images were evaluated with
computer-aided detection.

[R CC synth-2D]
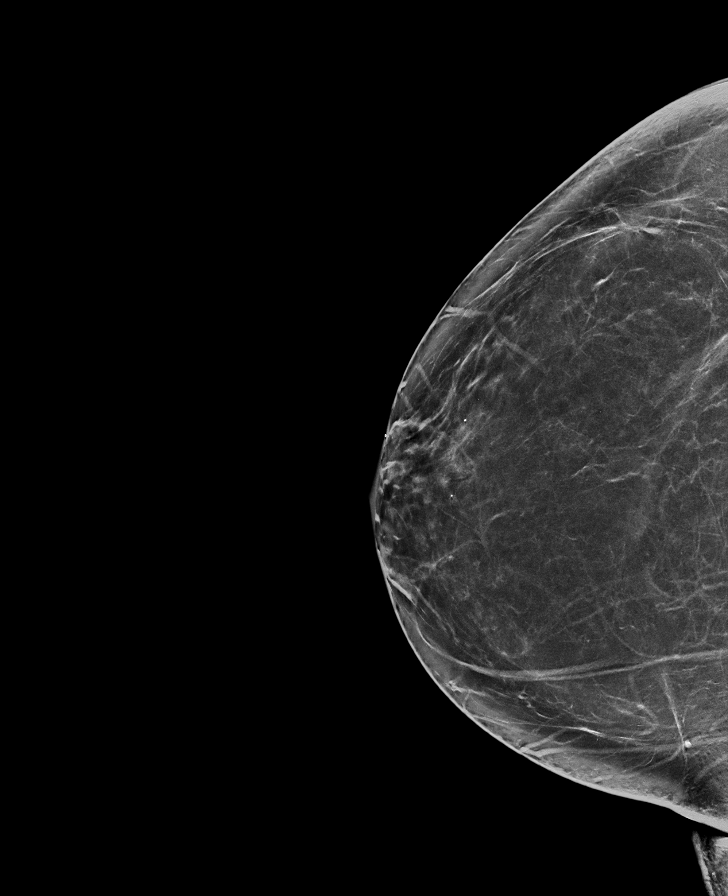

[R MLO synth-2D]
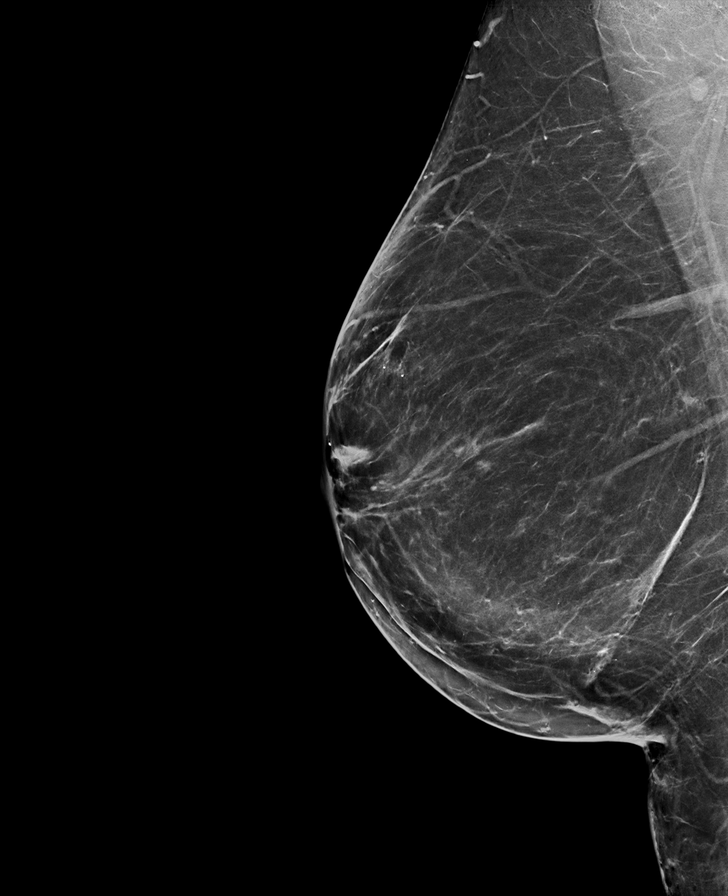

[L MLO synth-2D]
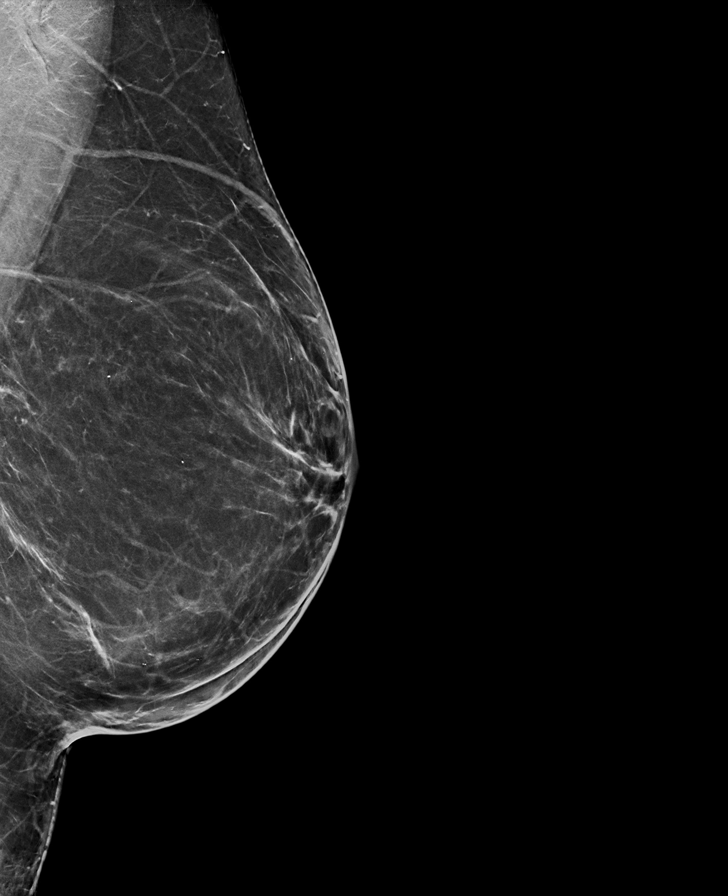

[L CC synth-2D]
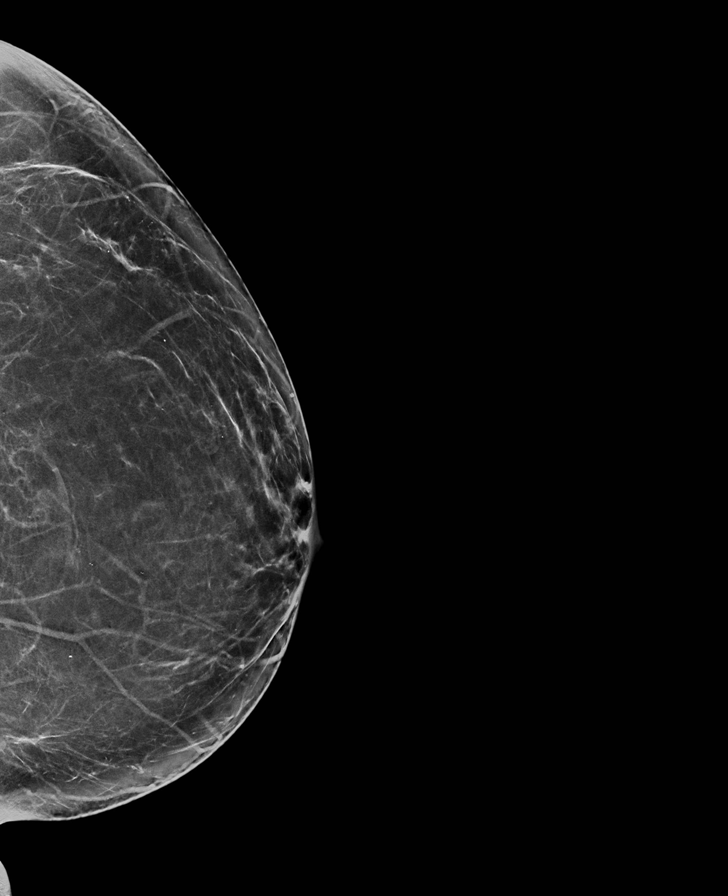

[L CC tomo · tomo slice 39/76.0]
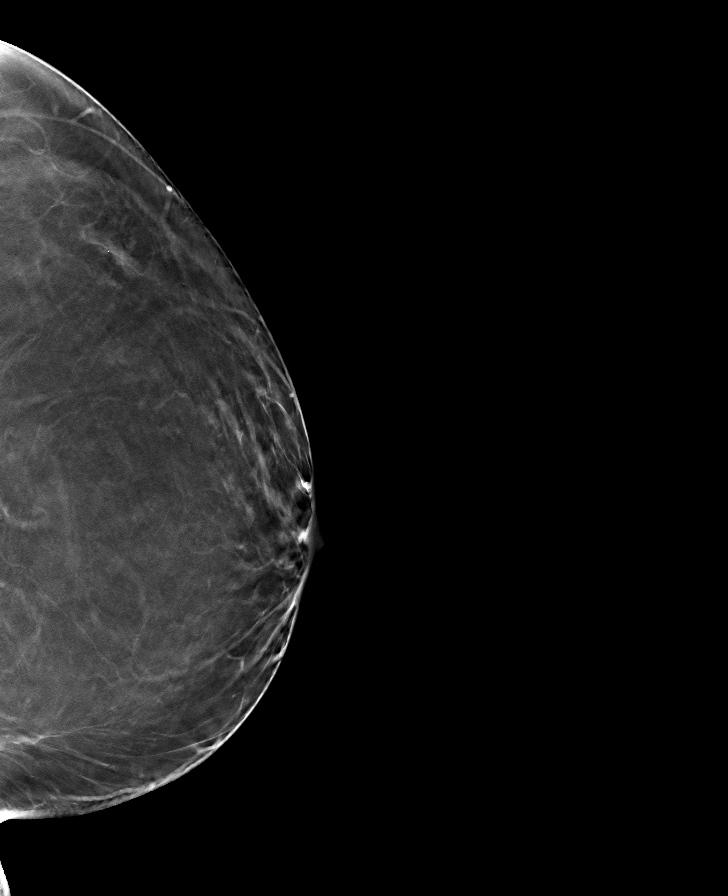

[R MLO tomo · tomo slice 40/79.0]
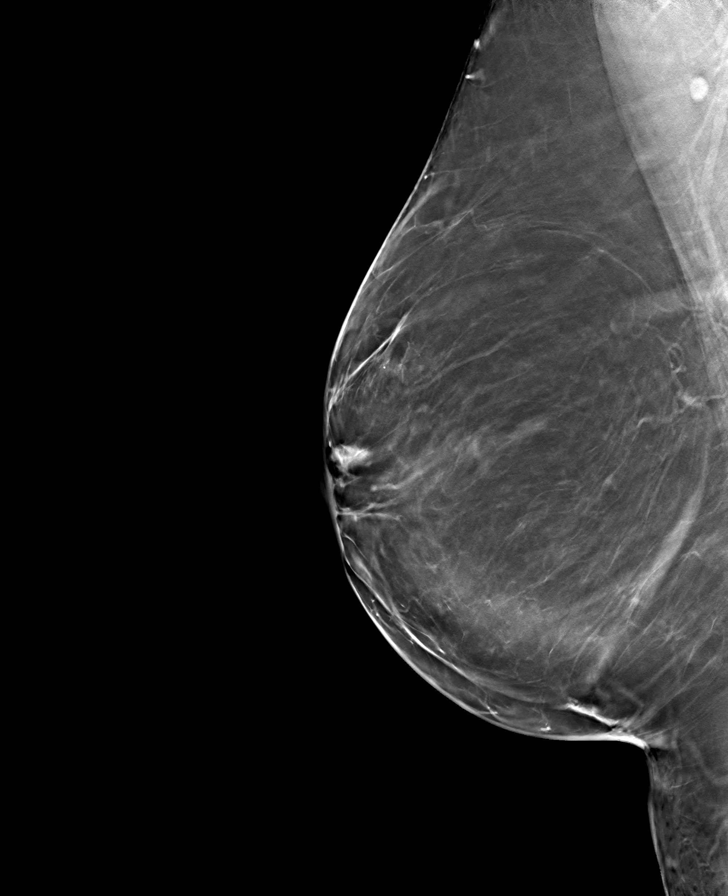

[L MLO tomo · tomo slice 39/78.0]
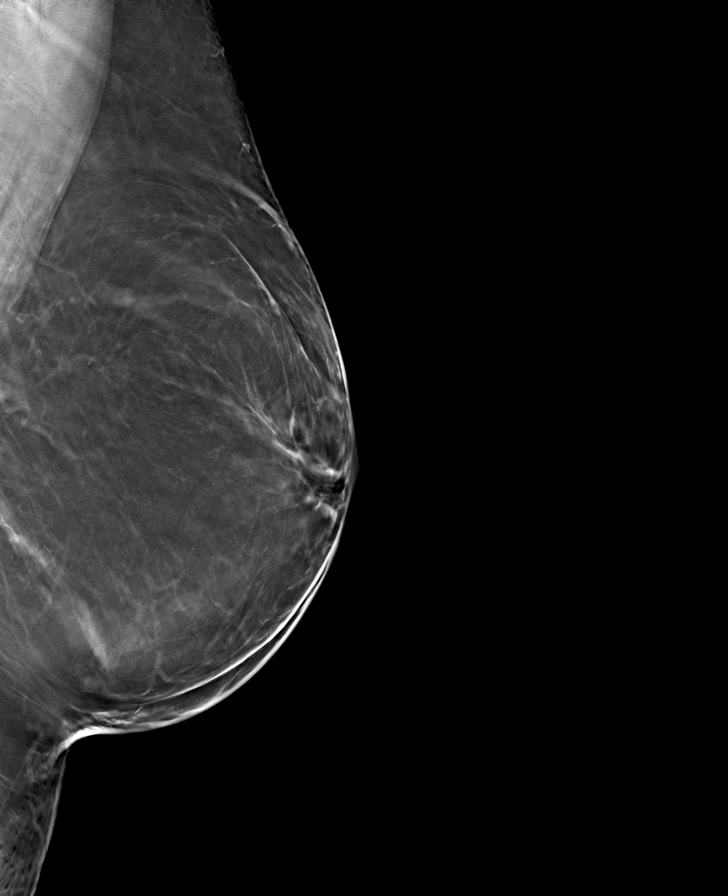

[R CC tomo · tomo slice 41/81.0]
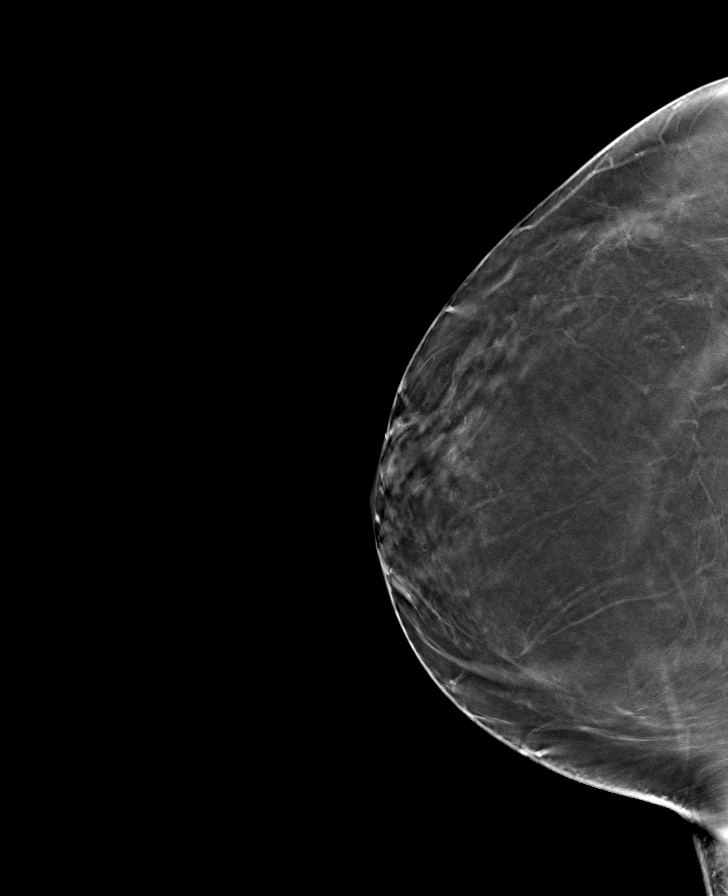

[8 of 24 positions shown; findings below may reference images not displayed]

ACR Breast Density Category b: There are scattered areas of
fibroglandular density.
FINDINGS: There are no findings suspicious for malignancy.
IMPRESSION: No mammographic evidence of malignancy. A result letter of this
screening mammogram will be mailed directly to the patient.

RECOMMENDATION:
Screening mammogram in one year. (Code:51-O-LD2)

BI-RADS CATEGORY  1: Negative.
